# Patient Record
Sex: Male | Born: 1970 | Race: White | Hispanic: No | Marital: Married | Smoking: Current every day smoker
Health system: Southern US, Community
[De-identification: ages and names within clinical notes are randomized; demographics above are authoritative.]

## PROBLEM LIST (undated history)

## (undated) DIAGNOSIS — I10 Essential (primary) hypertension: Secondary | ICD-10-CM

## (undated) HISTORY — DX: Essential (primary) hypertension: I10

---

## 2003-01-08 ENCOUNTER — Ambulatory Visit (HOSPITAL_COMMUNITY): Admission: RE | Admit: 2003-01-08 | Discharge: 2003-01-08 | Payer: Self-pay | Admitting: Family Medicine

## 2003-01-08 ENCOUNTER — Encounter: Payer: Self-pay | Admitting: Family Medicine

## 2005-11-04 ENCOUNTER — Ambulatory Visit (HOSPITAL_COMMUNITY): Admission: RE | Admit: 2005-11-04 | Discharge: 2005-11-04 | Payer: Self-pay | Admitting: Family Medicine

## 2005-11-04 ENCOUNTER — Emergency Department (HOSPITAL_COMMUNITY): Admission: EM | Admit: 2005-11-04 | Discharge: 2005-11-04 | Payer: Self-pay | Admitting: Family Medicine

## 2005-11-06 ENCOUNTER — Emergency Department (HOSPITAL_COMMUNITY): Admission: EM | Admit: 2005-11-06 | Discharge: 2005-11-06 | Payer: Self-pay | Admitting: Family Medicine

## 2005-11-16 ENCOUNTER — Ambulatory Visit: Payer: Self-pay | Admitting: Internal Medicine

## 2006-04-25 ENCOUNTER — Ambulatory Visit: Payer: Self-pay | Admitting: Internal Medicine

## 2006-05-12 ENCOUNTER — Ambulatory Visit: Payer: Self-pay | Admitting: Internal Medicine

## 2006-05-12 DIAGNOSIS — R079 Chest pain, unspecified: Secondary | ICD-10-CM

## 2006-09-01 DIAGNOSIS — M79609 Pain in unspecified limb: Secondary | ICD-10-CM

## 2006-09-12 DIAGNOSIS — F411 Generalized anxiety disorder: Secondary | ICD-10-CM | POA: Insufficient documentation

## 2006-09-12 DIAGNOSIS — A5601 Chlamydial cystitis and urethritis: Secondary | ICD-10-CM

## 2006-09-12 DIAGNOSIS — F17200 Nicotine dependence, unspecified, uncomplicated: Secondary | ICD-10-CM

## 2006-09-12 DIAGNOSIS — N453 Epididymo-orchitis: Secondary | ICD-10-CM | POA: Insufficient documentation

## 2006-09-12 DIAGNOSIS — I10 Essential (primary) hypertension: Secondary | ICD-10-CM | POA: Insufficient documentation

## 2006-09-15 ENCOUNTER — Ambulatory Visit: Payer: Self-pay | Admitting: Internal Medicine

## 2006-09-19 ENCOUNTER — Telehealth (INDEPENDENT_AMBULATORY_CARE_PROVIDER_SITE_OTHER): Payer: Self-pay | Admitting: *Deleted

## 2006-09-26 ENCOUNTER — Encounter (INDEPENDENT_AMBULATORY_CARE_PROVIDER_SITE_OTHER): Payer: Self-pay | Admitting: *Deleted

## 2006-10-06 ENCOUNTER — Ambulatory Visit: Payer: Self-pay | Admitting: Internal Medicine

## 2006-11-24 ENCOUNTER — Ambulatory Visit: Payer: Self-pay | Admitting: Family Medicine

## 2006-11-24 ENCOUNTER — Encounter (INDEPENDENT_AMBULATORY_CARE_PROVIDER_SITE_OTHER): Payer: Self-pay | Admitting: Internal Medicine

## 2006-12-01 ENCOUNTER — Ambulatory Visit: Payer: Self-pay | Admitting: Internal Medicine

## 2006-12-05 LAB — CONVERTED CEMR LAB
ALT: 22 units/L (ref 0–53)
AST: 21 units/L (ref 0–37)
Albumin: 4.1 g/dL (ref 3.5–5.2)
Alkaline Phosphatase: 87 units/L (ref 39–117)
BUN: 11 mg/dL (ref 6–23)
Basophils Absolute: 0 10*3/uL (ref 0.0–0.1)
Basophils Relative: 0.4 % (ref 0.0–1.0)
Bilirubin, Direct: 0.1 mg/dL (ref 0.0–0.3)
CO2: 29 meq/L (ref 19–32)
Calcium: 9.2 mg/dL (ref 8.4–10.5)
Chloride: 105 meq/L (ref 96–112)
Cholesterol: 203 mg/dL (ref 0–200)
Creatinine, Ser: 0.9 mg/dL (ref 0.4–1.5)
Direct LDL: 174.3 mg/dL
Eosinophils Absolute: 0.2 10*3/uL (ref 0.0–0.6)
Eosinophils Relative: 2.6 % (ref 0.0–5.0)
GFR calc Af Amer: 125 mL/min
GFR calc non Af Amer: 103 mL/min
Glucose, Bld: 104 mg/dL — ABNORMAL HIGH (ref 70–99)
HCT: 44.1 % (ref 39.0–52.0)
HDL: 25.6 mg/dL — ABNORMAL LOW (ref >39.0)
Hemoglobin: 15.2 g/dL (ref 13.0–17.0)
Lymphocytes Relative: 36 % (ref 12.0–46.0)
MCHC: 34.5 g/dL (ref 30.0–36.0)
MCV: 89.8 fL (ref 78.0–100.0)
Monocytes Absolute: 0.6 10*3/uL (ref 0.2–0.7)
Monocytes Relative: 9.4 % (ref 3.0–11.0)
Neutro Abs: 3.2 10*3/uL (ref 1.4–7.7)
Neutrophils Relative %: 51.6 % (ref 43.0–77.0)
Platelets: 296 10*3/uL (ref 150–400)
Potassium: 4.6 meq/L (ref 3.5–5.1)
RBC: 4.91 M/uL (ref 4.22–5.81)
RDW: 13.2 % (ref 11.5–14.6)
Sodium: 139 meq/L (ref 135–145)
TSH: 0.85 microintl units/mL (ref 0.35–5.50)
Total Bilirubin: 0.9 mg/dL (ref 0.3–1.2)
Total CHOL/HDL Ratio: 7.9
Total Protein: 6.8 g/dL (ref 6.0–8.3)
Triglycerides: 75 mg/dL (ref 0–149)
VLDL: 15 mg/dL (ref 0–40)
WBC: 6.2 10*3/uL (ref 4.5–10.5)

## 2007-03-02 ENCOUNTER — Telehealth (INDEPENDENT_AMBULATORY_CARE_PROVIDER_SITE_OTHER): Payer: Self-pay | Admitting: *Deleted

## 2007-06-08 ENCOUNTER — Ambulatory Visit: Payer: Self-pay | Admitting: Internal Medicine

## 2007-06-11 ENCOUNTER — Encounter (INDEPENDENT_AMBULATORY_CARE_PROVIDER_SITE_OTHER): Payer: Self-pay | Admitting: *Deleted

## 2007-10-11 ENCOUNTER — Ambulatory Visit: Payer: Self-pay | Admitting: Family Medicine

## 2007-10-11 DIAGNOSIS — J069 Acute upper respiratory infection, unspecified: Secondary | ICD-10-CM | POA: Insufficient documentation

## 2008-12-12 ENCOUNTER — Ambulatory Visit: Payer: Self-pay | Admitting: Internal Medicine

## 2008-12-12 DIAGNOSIS — S60529A Blister (nonthermal) of unspecified hand, initial encounter: Secondary | ICD-10-CM | POA: Insufficient documentation

## 2009-08-08 ENCOUNTER — Ambulatory Visit: Payer: Self-pay | Admitting: Internal Medicine

## 2013-03-09 ENCOUNTER — Other Ambulatory Visit: Payer: Self-pay | Admitting: Psychiatry

## 2013-03-09 LAB — COMPREHENSIVE METABOLIC PANEL
Albumin: 3.7 g/dL (ref 3.4–5.0)
Alkaline Phosphatase: 108 U/L
Anion Gap: 3 — ABNORMAL LOW (ref 7–16)
BUN: 12 mg/dL (ref 7–18)
Bilirubin,Total: 0.3 mg/dL (ref 0.2–1.0)
Calcium, Total: 8.8 mg/dL (ref 8.5–10.1)
Chloride: 109 mmol/L — ABNORMAL HIGH (ref 98–107)
Co2: 27 mmol/L (ref 21–32)
Creatinine: 0.99 mg/dL (ref 0.60–1.30)
EGFR (African American): >60
EGFR (Non-African Amer.): >60
Glucose: 121 mg/dL — ABNORMAL HIGH (ref 65–99)
Osmolality: 279 (ref 275–301)
Potassium: 4.3 mmol/L (ref 3.5–5.1)
SGOT(AST): 15 U/L (ref 15–37)
SGPT (ALT): 29 U/L (ref 12–78)
Sodium: 139 mmol/L (ref 136–145)
Total Protein: 6.9 g/dL (ref 6.4–8.2)

## 2013-03-09 LAB — CBC WITH DIFFERENTIAL/PLATELET
Basophil #: 0.1 10*3/uL (ref 0.0–0.1)
Basophil %: 0.8 %
Eosinophil #: 0.2 10*3/uL (ref 0.0–0.7)
Eosinophil %: 3.1 %
HCT: 44.3 % (ref 40.0–52.0)
HGB: 14.9 g/dL (ref 13.0–18.0)
Lymphocyte #: 2.5 10*3/uL (ref 1.0–3.6)
Lymphocyte %: 35.7 %
MCH: 29.7 pg (ref 26.0–34.0)
MCHC: 33.6 g/dL (ref 32.0–36.0)
MCV: 89 fL (ref 80–100)
Monocyte #: 0.7 x10 3/mm (ref 0.2–1.0)
Monocyte %: 9.6 %
Neutrophil #: 3.6 10*3/uL (ref 1.4–6.5)
Neutrophil %: 50.8 %
Platelet: 305 10*3/uL (ref 150–440)
RBC: 5 10*6/uL (ref 4.40–5.90)
RDW: 13.9 % (ref 11.5–14.5)
WBC: 7 10*3/uL (ref 3.8–10.6)

## 2013-03-09 LAB — VALPROIC ACID LEVEL: Valproic Acid: 76 ug/mL

## 2013-04-06 ENCOUNTER — Other Ambulatory Visit: Payer: Self-pay | Admitting: Psychiatry

## 2013-04-06 LAB — CBC WITH DIFFERENTIAL/PLATELET
BASOS PCT: 1.1 %
Basophil #: 0.1 10*3/uL (ref 0.0–0.1)
Eosinophil #: 0.3 10*3/uL (ref 0.0–0.7)
Eosinophil %: 4.2 %
HCT: 43.7 % (ref 40.0–52.0)
HGB: 15.1 g/dL (ref 13.0–18.0)
LYMPHS ABS: 2.3 10*3/uL (ref 1.0–3.6)
Lymphocyte %: 30.1 %
MCH: 30.8 pg (ref 26.0–34.0)
MCHC: 34.6 g/dL (ref 32.0–36.0)
MCV: 89 fL (ref 80–100)
MONO ABS: 1 x10 3/mm (ref 0.2–1.0)
Monocyte %: 12.5 %
NEUTROS ABS: 4 10*3/uL (ref 1.4–6.5)
NEUTROS PCT: 52.1 %
PLATELETS: 299 10*3/uL (ref 150–440)
RBC: 4.9 10*6/uL (ref 4.40–5.90)
RDW: 14.1 % (ref 11.5–14.5)
WBC: 7.8 10*3/uL (ref 3.8–10.6)

## 2013-04-06 LAB — COMPREHENSIVE METABOLIC PANEL
ALT: 32 U/L (ref 12–78)
Albumin: 3.5 g/dL (ref 3.4–5.0)
Alkaline Phosphatase: 93 U/L
Anion Gap: 6 — ABNORMAL LOW (ref 7–16)
BUN: 15 mg/dL (ref 7–18)
Bilirubin,Total: 0.3 mg/dL (ref 0.2–1.0)
CHLORIDE: 107 mmol/L (ref 98–107)
Calcium, Total: 8.5 mg/dL (ref 8.5–10.1)
Co2: 26 mmol/L (ref 21–32)
Creatinine: 0.91 mg/dL (ref 0.60–1.30)
EGFR (African American): >60
GLUCOSE: 100 mg/dL — AB (ref 65–99)
Osmolality: 278 (ref 275–301)
Potassium: 4.7 mmol/L (ref 3.5–5.1)
SGOT(AST): 15 U/L (ref 15–37)
Sodium: 139 mmol/L (ref 136–145)
TOTAL PROTEIN: 7 g/dL (ref 6.4–8.2)

## 2013-04-06 LAB — VALPROIC ACID LEVEL: Valproic Acid: 65 ug/mL

## 2013-06-07 ENCOUNTER — Ambulatory Visit: Payer: Self-pay | Admitting: Otolaryngology

## 2013-07-19 ENCOUNTER — Ambulatory Visit: Payer: Self-pay | Admitting: Otolaryngology

## 2014-02-25 ENCOUNTER — Ambulatory Visit: Payer: Self-pay | Admitting: Physician Assistant

## 2014-02-28 ENCOUNTER — Emergency Department: Payer: Self-pay | Admitting: Emergency Medicine

## 2015-02-04 ENCOUNTER — Other Ambulatory Visit: Payer: Self-pay | Admitting: Occupational Medicine

## 2015-02-04 ENCOUNTER — Ambulatory Visit: Payer: Self-pay

## 2015-02-04 DIAGNOSIS — Z0000 Encounter for general adult medical examination without abnormal findings: Secondary | ICD-10-CM

## 2015-08-25 IMAGING — CT CT CHEST W/O CM
2 of 4 series · 15 of 36 positions shown, 18 images · non-contrast
Comparison: February 25, 2014 chest radiograph

CLINICAL DATA: Cough and sudden onset left-sided pain

EXAM:
CT CHEST WITHOUT CONTRAST
TECHNIQUE: Multidetector CT imaging of the chest was performed following the
standard protocol without IV contrast material administration.

[Series 2: routine chest wo · axial · 0.75mm/px · z∈[-770,-494]mm · 12 of 67 slices shown, 15 images]
[im 6/67  mediastinal]
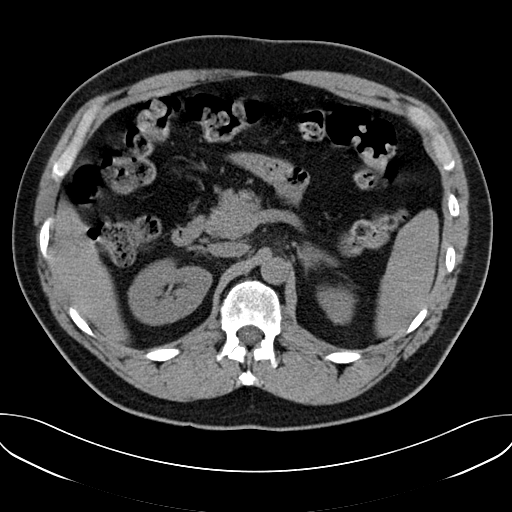
[im 6/67  lung]
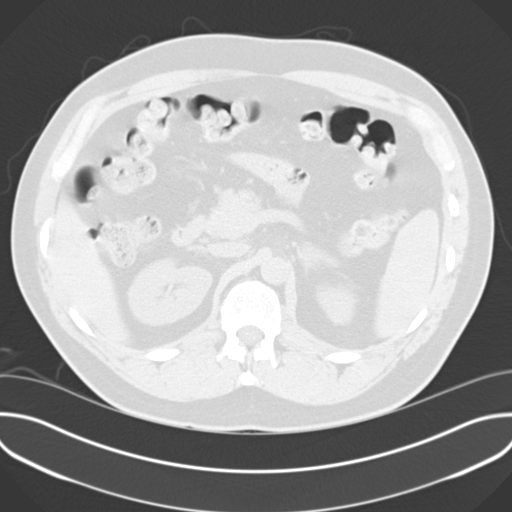
[im 11/67  lung]
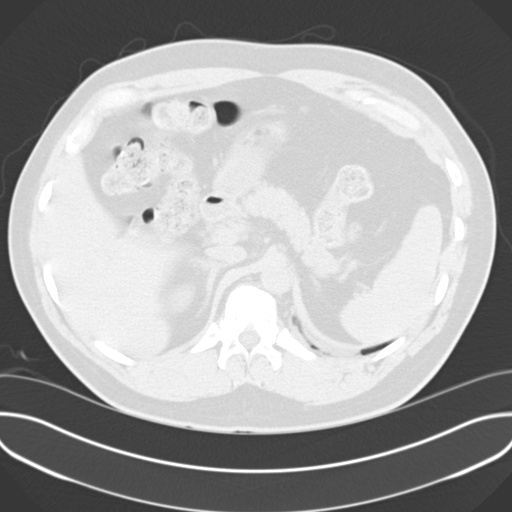
[im 16/67  lung]
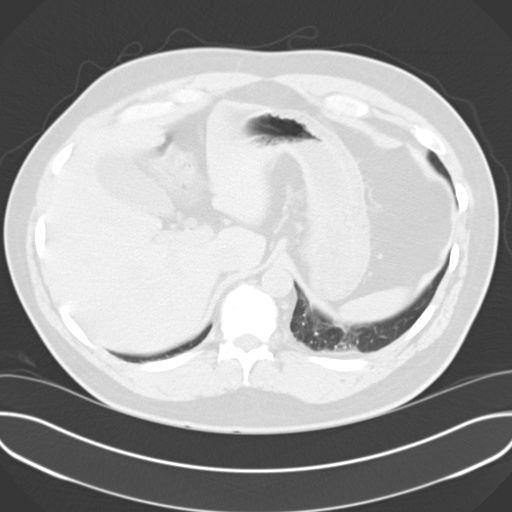
[im 21/67  lung]
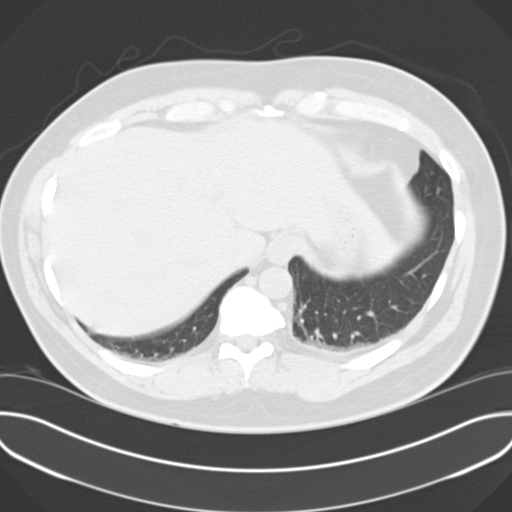
[im 26/67  mediastinal]
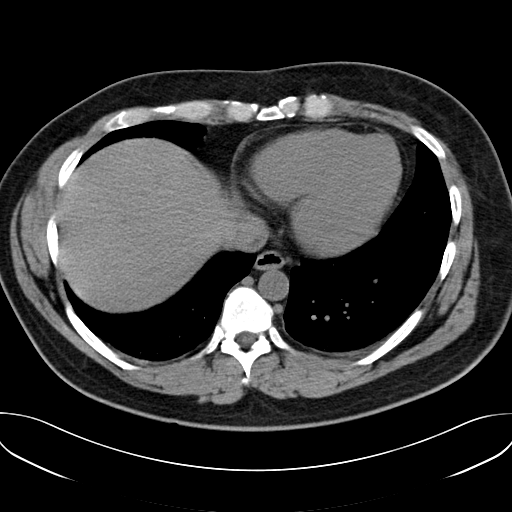
[im 26/67  lung]
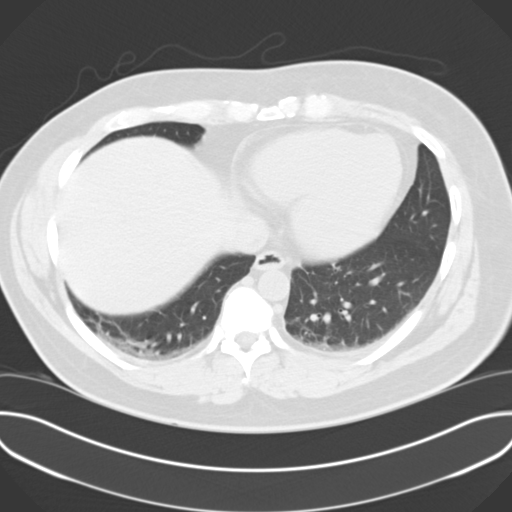
[im 31/67  lung]
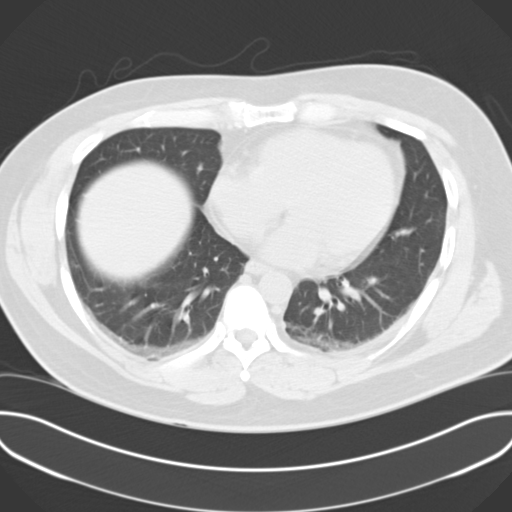
[im 36/67  lung]
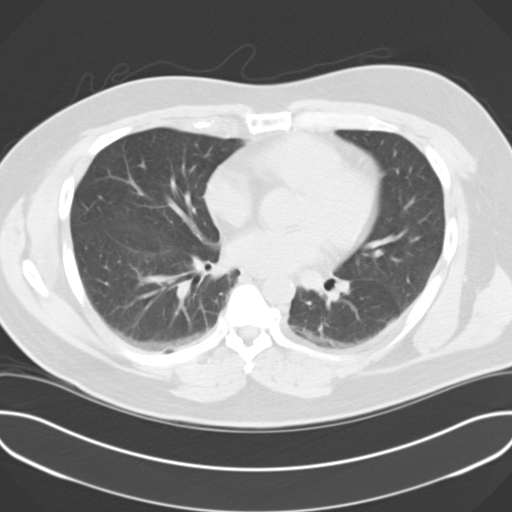
[im 41/67  lung]
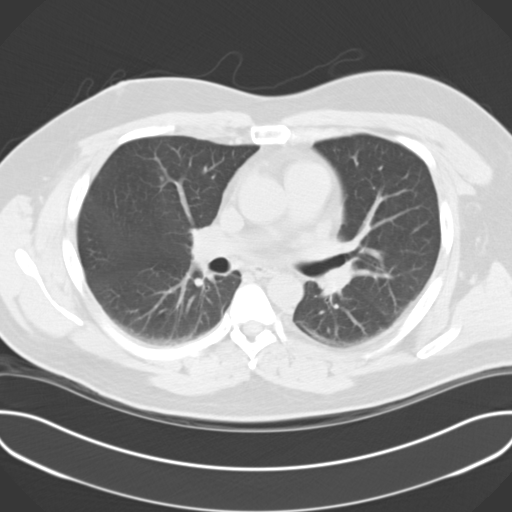
[im 46/67  mediastinal]
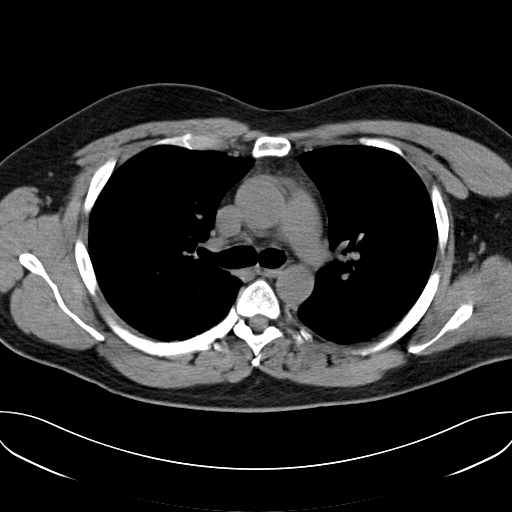
[im 46/67  lung]
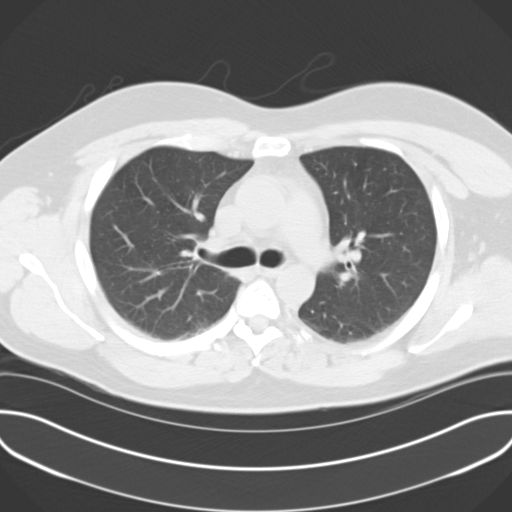
[im 51/67  lung]
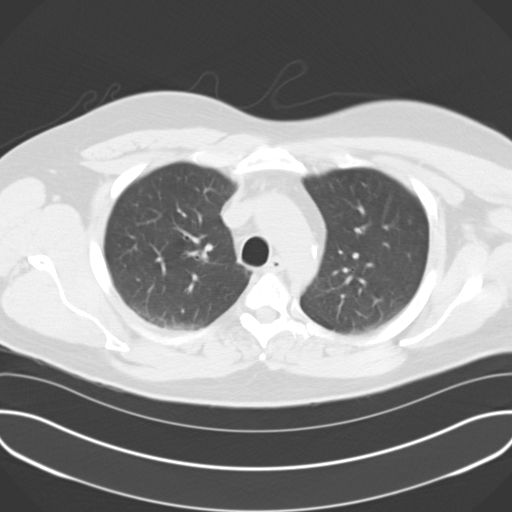
[im 56/67  lung]
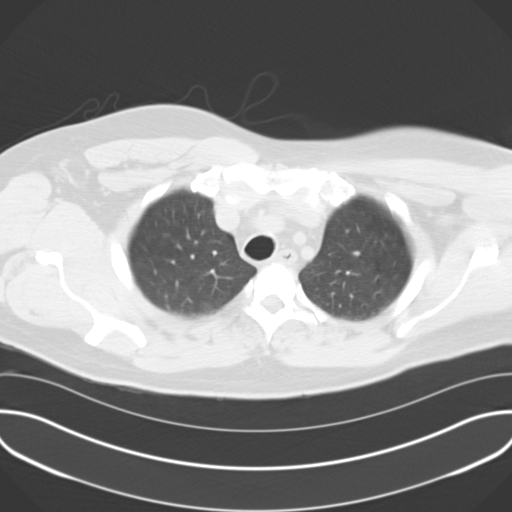
[im 61/67  lung]
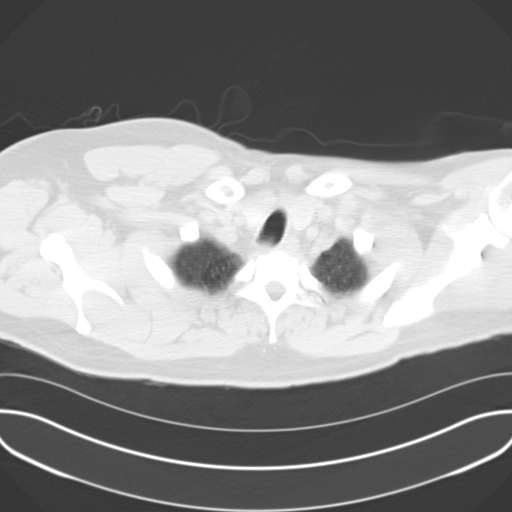

[Series 5: cor routine chest wo · coronal · 0.64mm/px · 3 of 134 slices shown]
[im 27/134  lung]
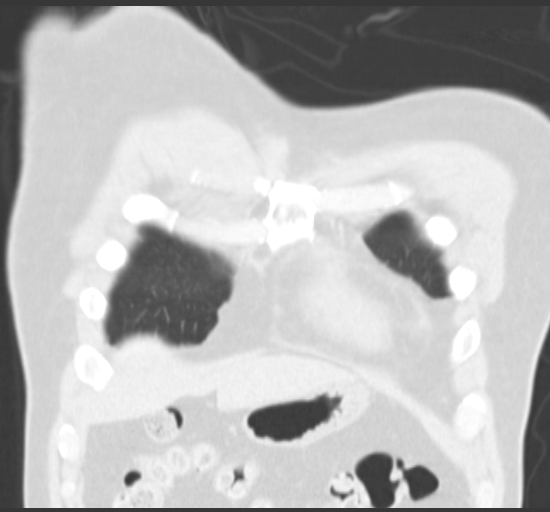
[im 54/134  lung]
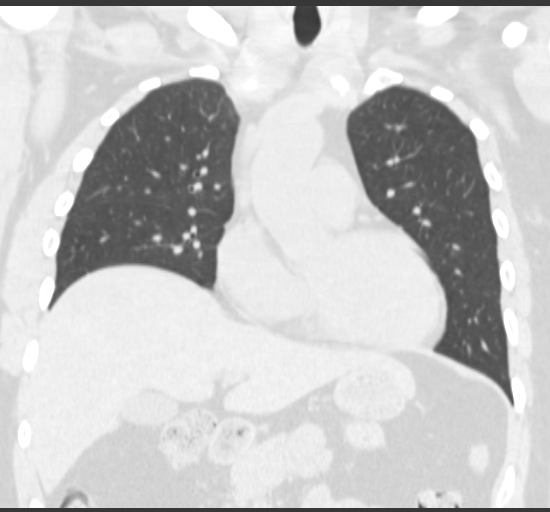
[im 80/134  lung]
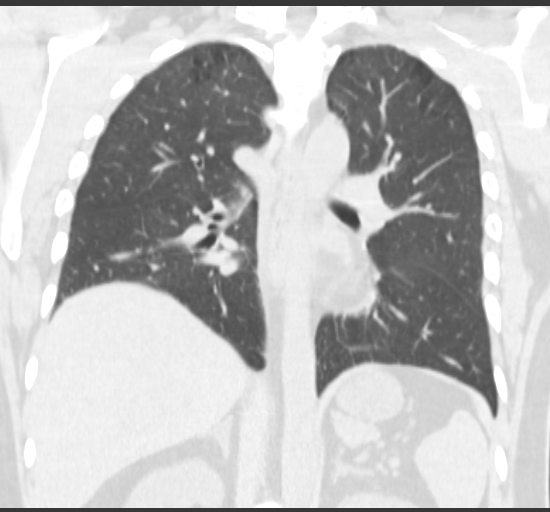

[15 of 36 positions shown; findings below may reference images not displayed]

FINDINGS: There is mild bibasilar lung atelectatic change. There is no
pneumothorax. There is no edema or consolidation.

There is mild atherosclerotic change in the aortic arch region. No
thoracic aortic aneurysm is appreciable. There is no periaortic
fluid. No pulmonary embolus is seen on this noncontrast enhanced
study. There is no appreciable thoracic adenopathy. The pericardium
is not thickened.

Visualized upper abdominal structures appear unremarkable.

There is a nondisplaced fracture of the posterolateral left eighth
rib. No other fractures are apparent. Visualized thyroid appears
normal.
IMPRESSION: Nondisplaced fracture posterolateral left eighth rib. No
pneumothorax. Mild bibasilar atelectatic change. No lung edema or
consolidation. Study otherwise unremarkable except for some
atherosclerotic change in the aortic arch region with calcification.

## 2015-11-21 ENCOUNTER — Emergency Department
Admission: EM | Admit: 2015-11-21 | Discharge: 2015-11-21 | Disposition: A | Discharge status: Home / Self Care | Payer: Commercial Managed Care - PPO | Attending: Emergency Medicine | Admitting: Emergency Medicine

## 2015-11-21 DIAGNOSIS — S61212A Laceration without foreign body of right middle finger without damage to nail, initial encounter: Secondary | ICD-10-CM | POA: Diagnosis present

## 2015-11-21 DIAGNOSIS — I10 Essential (primary) hypertension: Secondary | ICD-10-CM | POA: Insufficient documentation

## 2015-11-21 DIAGNOSIS — Y990 Civilian activity done for income or pay: Secondary | ICD-10-CM | POA: Insufficient documentation

## 2015-11-21 DIAGNOSIS — W231XXA Caught, crushed, jammed, or pinched between stationary objects, initial encounter: Secondary | ICD-10-CM | POA: Insufficient documentation

## 2015-11-21 DIAGNOSIS — Y9389 Activity, other specified: Secondary | ICD-10-CM | POA: Insufficient documentation

## 2015-11-21 DIAGNOSIS — S61202A Unspecified open wound of right middle finger without damage to nail, initial encounter: Secondary | ICD-10-CM | POA: Insufficient documentation

## 2015-11-21 DIAGNOSIS — Y9269 Other specified industrial and construction area as the place of occurrence of the external cause: Secondary | ICD-10-CM | POA: Diagnosis not present

## 2015-11-21 DIAGNOSIS — S61208A Unspecified open wound of other finger without damage to nail, initial encounter: Secondary | ICD-10-CM

## 2015-11-21 MED ORDER — TETANUS-DIPHTH-ACELL PERTUSSIS 5-2.5-18.5 LF-MCG/0.5 IM SUSP
0.5000 mL | Freq: Once | INTRAMUSCULAR | Status: AC
  Administered 2015-11-21: 0.5 mL via INTRAMUSCULAR
  Filled 2015-11-21: qty 0.5

## 2015-11-21 NOTE — Discharge Instructions (Signed)
Keep area clean and dry. Wash daily with mild soap and water. Watch for signs of infection. Today you are given a tetanus booster.

## 2015-11-21 NOTE — ED Triage Notes (Signed)
Patient presents to the ED with a avulsion to the right middle finger tip at 1000am on 11/20/15. Patient is not up to date on tetanus.

## 2015-11-21 NOTE — ED Provider Notes (Signed)
[facility] Emergency Department Provider Note  ____________________________________________   First MD Initiated Contact with Patient 11/21/15 1106     (approximate)  I have reviewed the triage vital signs and the nursing notes.   HISTORY  Chief Complaint Laceration   HPI Victor Ali is a 45 y.o. male is here with complaint of an avulsion to the tip of his right middle finger that happened at 10 AM yesterday. Patient did clean the area and has had a dressing on it. Patient is not up-to-date on immunizations. 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 Currently he is rating his pain as a 6/10.   No past medical history on file.  Patient Active Problem List   Diagnosis Date Noted  . 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 URI 10/11/2007  . CHLAMYDIAL INFECTION 09/12/2006  . ANXIETY 09/12/2006  . TOBACCO ABUSE 09/12/2006  . HYPERTENSION 09/12/2006  . EPIDIDYMITIS 09/12/2006  . FINGER PAIN 09/01/2006  . CHEST PAIN 05/12/2006    No past surgical history on file.  Prior to Admission medications   Not on File    Allergies Review of patient's allergies indicates not on file.  No family history on file.  Social History Social History  Substance Use Topics  . Smoking status: Not on file  . Smokeless tobacco: Not on file  . Alcohol use Not on file    Review of Systems Constitutional: No fever/chills Cardiovascular: Denies chest pain. Respiratory: Denies shortness of breath. Gastrointestinal:   No nausea, no vomiting.  Musculoskeletal: Negative for back pain. Skin: Skin avulsion right third finger. Neurological: Negative for headaches, focal weakness or numbness.  10-point ROS otherwise negative.  ____________________________________________   PHYSICAL EXAM:  VITAL SIGNS: ED Triage Vitals  Enc Vitals Group     BP 11/21/15 1059 (!) 141/79     Pulse Rate 11/21/15 1059 79     Resp 11/21/15  1059 18     Temp 11/21/15 1059 97.9 F (36.6 C)     Temp Source 11/21/15 1059 Oral     SpO2 11/21/15 1059 98 %     Weight 11/21/15 1059 202 lb (91.6 kg)     Height 11/21/15 1059 5\' 11"  (1.803 m)     Head Circumference --      Peak Flow --      Pain Score 11/21/15 1100 6     Pain Loc --      Pain Edu? --      Excl. in GC? --     Constitutional: Alert and oriented. Well appearing and in no acute distress. Eyes: Conjunctivae are normal. PERRL. EOMI. Head: Atraumatic. Neck: No stridor.   Cardiovascular: Normal rate, regular rhythm. Grossly normal heart sounds.  Good peripheral circulation. Respiratory: Normal respiratory effort.  No retractions. Lungs CTAB. Musculoskeletal: Moves right middle finger without any difficulty. No gross deformity was noted. Motor sensory function intact. Neurologic:  Normal speech and language. No gross focal neurologic deficits are appreciated. No gait instability. Skin:  Skin is warm, dry.  There are aspect distal tip right third finger there is partial-thickness skin avulsed without any active bleeding or signs of infection. Psychiatric: Mood and affect are normal. Speech and behavior are normal.  ____________________________________________   LABS (all labs ordered are listed, but only abnormal results are displayed)  Labs Reviewed - No data to display  PROCEDURES  Procedure(s) performed: None  Procedures  Critical Care performed: No  ____________________________________________  INITIAL IMPRESSION / ASSESSMENT AND PLAN / ED COURSE  Pertinent labs & imaging results that were available during my care of the patient were reviewed by me and considered in my medical decision making (see chart for details).    Clinical Course   Patient's tetanus was updated. Area was cleaned and a dressing was placed. Patient is aware that he needs to keep this area clean and dry. He is to watch for signs of infection. He is encouraged to wash area with  mild soap and water twice a day. Patient is to take Tylenol or ibuprofen as needed for pain. He will return to the emergency room or see his primary care doctor if any signs of infection.  ____________________________________________   FINAL CLINICAL IMPRESSION(S) / ED DIAGNOSES  Final diagnoses:  Avulsion of skin of middle finger, initial encounter      NEW MEDICATIONS STARTED DURING THIS VISIT:  There are no discharge medications for this patient.    Note:  This document was prepared using Dragon voice recognition software and may include unintentional dictation errors.    Tommi Rumps, PA-C 11/21/15 1503    Myrna Blazer, MD 11/26/15 2110

## 2015-11-21 NOTE — ED Notes (Signed)
Patient states that he was at work yesterday and was lifting a piece of equipment his finger got caught and it pulled the top of the skin off of his right middle finger.

## 2016-07-31 IMAGING — CR DG CHEST 1V
2 series · 2 of 2 positions shown · non-contrast
Comparison: CT chest of 02/28/2014 and chest x-ray of 02/25/2014

CLINICAL DATA: For physical exam, current smoking history

EXAM:
CHEST 1 VIEW

[view not recorded (1 of 2)]
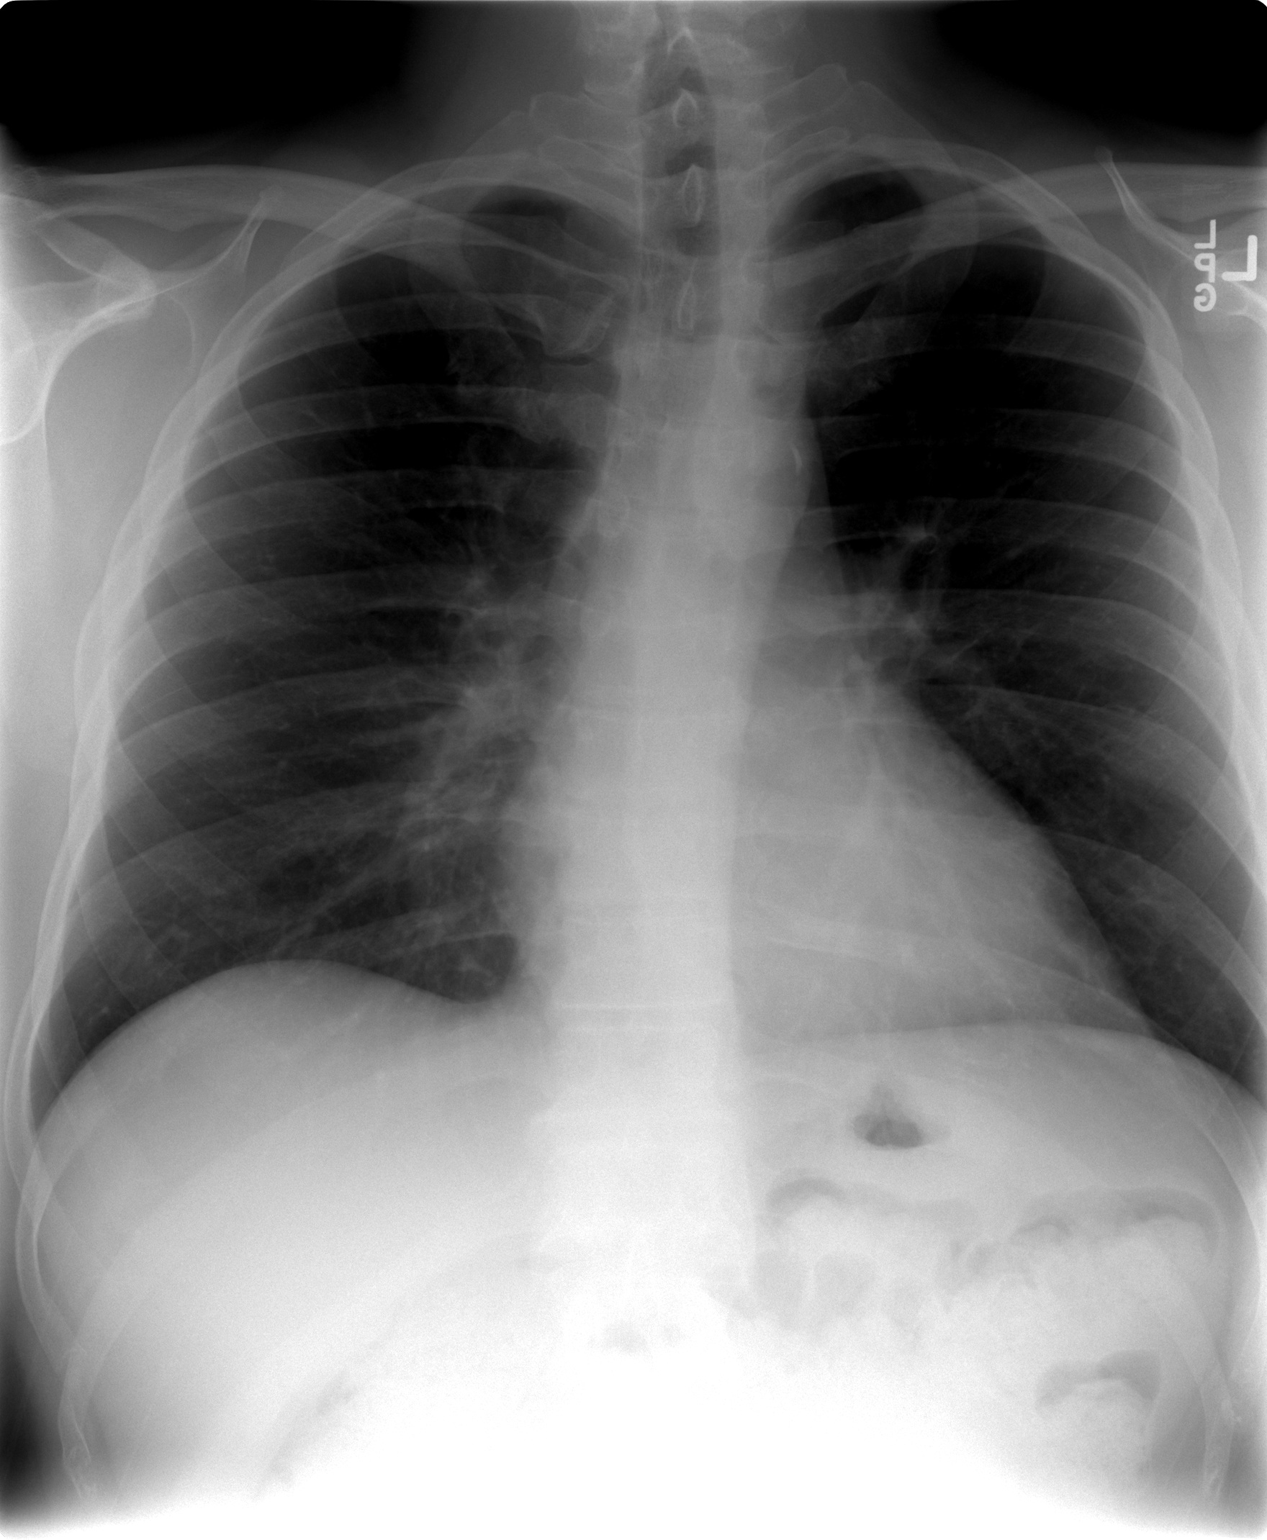

[view not recorded (2 of 2)]
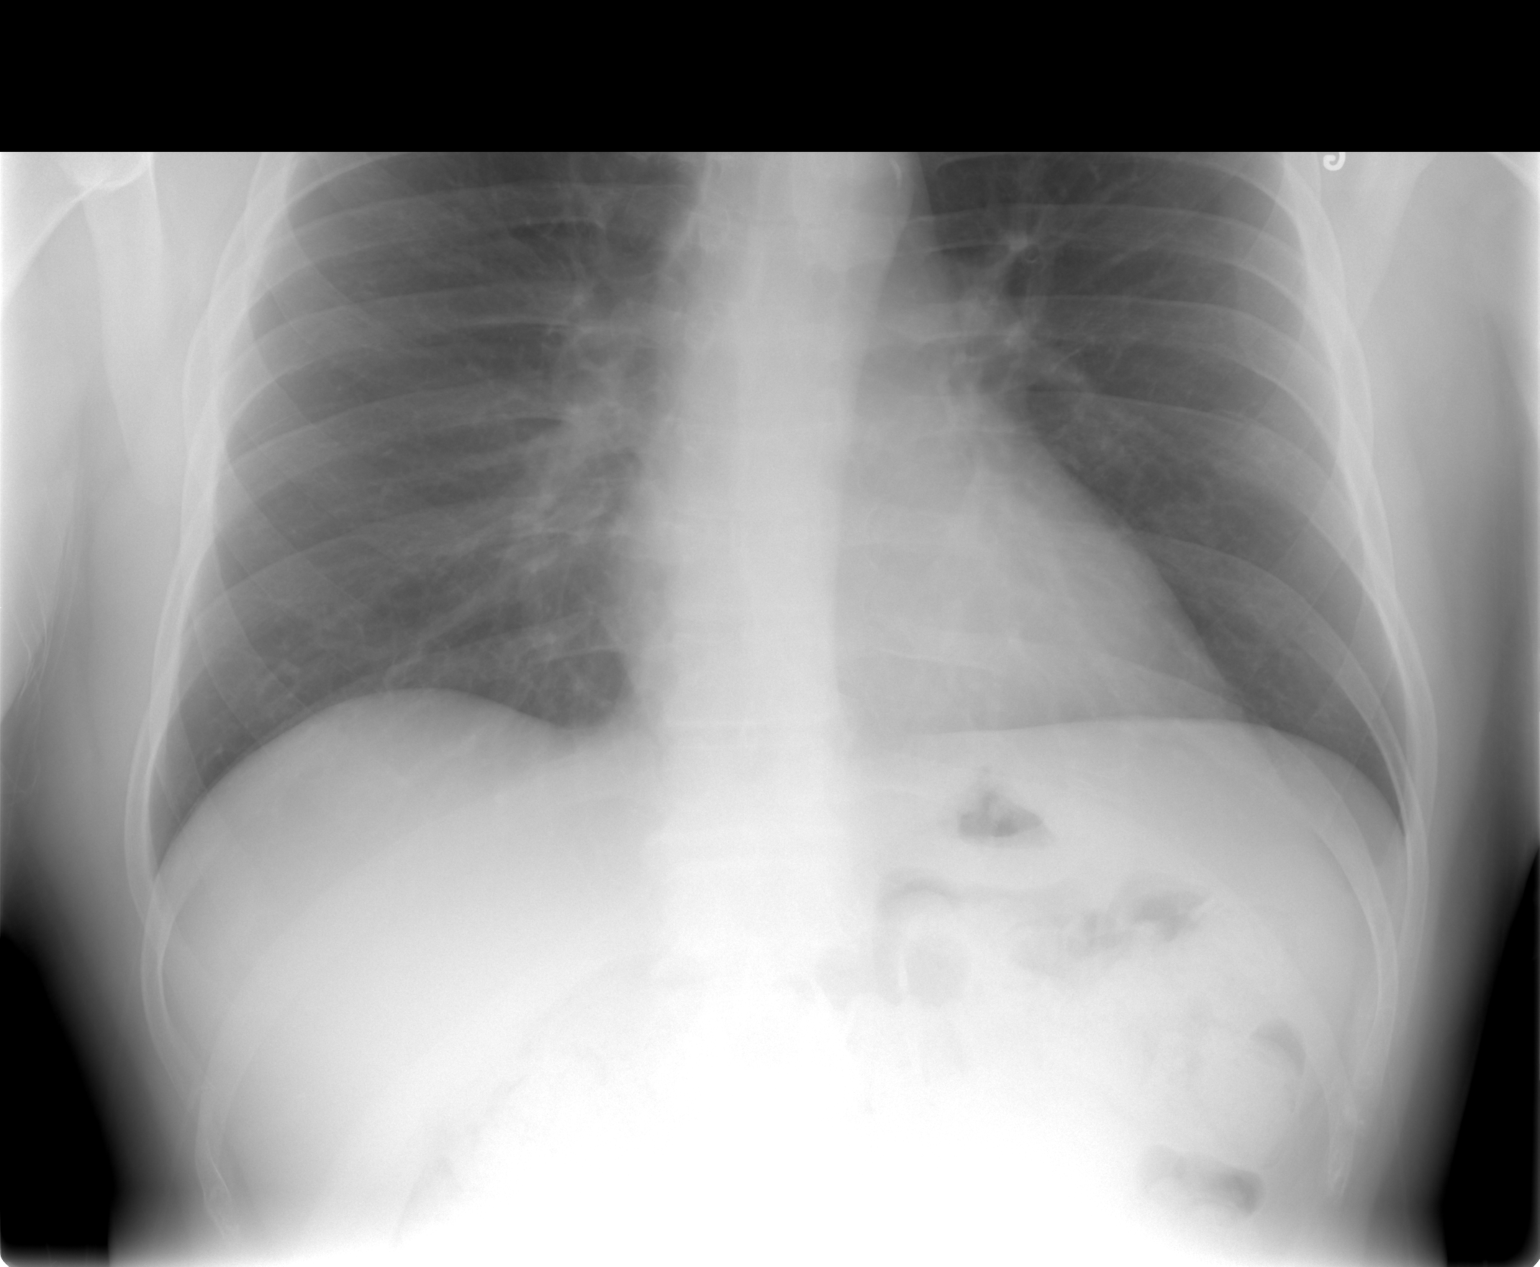

[2 of 2 positions shown; findings below may reference images not displayed]

FINDINGS: No active infiltrate or effusion is seen. Mediastinal and hilar
contours are unremarkable. The heart is within normal limits in
size. No bony abnormality is seen.
IMPRESSION: No active disease.

## 2023-01-07 ENCOUNTER — Inpatient Hospital Stay (HOSPITAL_COMMUNITY)
Admission: EM | Admit: 2023-01-07 | Discharge: 2023-01-10 | DRG: 914 | Disposition: A | Discharge status: Home / Self Care | Payer: BC Managed Care – PPO | Attending: General Surgery | Admitting: General Surgery

## 2023-01-07 ENCOUNTER — Other Ambulatory Visit: Payer: Self-pay

## 2023-01-07 ENCOUNTER — Emergency Department (HOSPITAL_COMMUNITY): Payer: BC Managed Care – PPO

## 2023-01-07 DIAGNOSIS — S71131A Puncture wound without foreign body, right thigh, initial encounter: Principal | ICD-10-CM

## 2023-01-07 DIAGNOSIS — W3400XA Accidental discharge from unspecified firearms or gun, initial encounter: Secondary | ICD-10-CM

## 2023-01-07 DIAGNOSIS — I10 Essential (primary) hypertension: Secondary | ICD-10-CM | POA: Diagnosis present

## 2023-01-07 DIAGNOSIS — S7411XA Injury of femoral nerve at hip and thigh level, right leg, initial encounter: Secondary | ICD-10-CM | POA: Diagnosis present

## 2023-01-07 DIAGNOSIS — Z6829 Body mass index (BMI) 29.0-29.9, adult: Secondary | ICD-10-CM

## 2023-01-07 DIAGNOSIS — E669 Obesity, unspecified: Secondary | ICD-10-CM | POA: Diagnosis present

## 2023-01-07 DIAGNOSIS — Z888 Allergy status to other drugs, medicaments and biological substances status: Secondary | ICD-10-CM

## 2023-01-07 DIAGNOSIS — S31143A Puncture wound of abdominal wall with foreign body, right lower quadrant without penetration into peritoneal cavity, initial encounter: Principal | ICD-10-CM | POA: Diagnosis present

## 2023-01-07 DIAGNOSIS — E872 Acidosis, unspecified: Secondary | ICD-10-CM | POA: Diagnosis present

## 2023-01-07 DIAGNOSIS — E785 Hyperlipidemia, unspecified: Secondary | ICD-10-CM | POA: Diagnosis present

## 2023-01-07 DIAGNOSIS — Z79899 Other long term (current) drug therapy: Secondary | ICD-10-CM

## 2023-01-07 DIAGNOSIS — D72829 Elevated white blood cell count, unspecified: Secondary | ICD-10-CM | POA: Diagnosis present

## 2023-01-07 LAB — I-STAT CHEM 8, ED
BUN: 13 mg/dL (ref 6–20)
Calcium, Ion: 1.1 mmol/L — ABNORMAL LOW (ref 1.15–1.40)
Chloride: 107 mmol/L (ref 98–111)
Creatinine, Ser: 1.1 mg/dL (ref 0.61–1.24)
Glucose, Bld: 123 mg/dL — ABNORMAL HIGH (ref 70–99)
HCT: 42 % (ref 39.0–52.0)
Hemoglobin: 14.3 g/dL (ref 13.0–17.0)
Potassium: 3.6 mmol/L (ref 3.5–5.1)
Sodium: 139 mmol/L (ref 135–145)
TCO2: 21 mmol/L — ABNORMAL LOW (ref 22–32)

## 2023-01-07 LAB — PROTIME-INR
INR: 1 (ref 0.8–1.2)
Prothrombin Time: 13.1 s (ref 11.4–15.2)

## 2023-01-07 LAB — CBC
HCT: 41.3 % (ref 39.0–52.0)
HCT: 41.2 % (ref 39.0–52.0)
Hemoglobin: 14.2 g/dL (ref 13.0–17.0)
Hemoglobin: 14.2 g/dL (ref 13.0–17.0)
MCH: 30.7 pg (ref 26.0–34.0)
MCH: 31.6 pg (ref 26.0–34.0)
MCHC: 34.4 g/dL (ref 30.0–36.0)
MCHC: 34.5 g/dL (ref 30.0–36.0)
MCV: 89.2 fL (ref 80.0–100.0)
MCV: 91.6 fL (ref 80.0–100.0)
Platelets: 361 10*3/uL (ref 150–400)
Platelets: 315 10*3/uL (ref 150–400)
RBC: 4.63 MIL/uL (ref 4.22–5.81)
RBC: 4.5 MIL/uL (ref 4.22–5.81)
RDW: 13.8 % (ref 11.5–15.5)
RDW: 13.8 % (ref 11.5–15.5)
WBC: 11.3 10*3/uL — ABNORMAL HIGH (ref 4.0–10.5)
WBC: 15.2 10*3/uL — ABNORMAL HIGH (ref 4.0–10.5)
nRBC: 0 % (ref 0.0–0.2)
nRBC: 0 % (ref 0.0–0.2)

## 2023-01-07 LAB — COMPREHENSIVE METABOLIC PANEL
ALT: 31 U/L (ref 0–44)
AST: 30 U/L (ref 15–41)
Albumin: 3.5 g/dL (ref 3.5–5.0)
Alkaline Phosphatase: 83 U/L (ref 38–126)
Anion gap: 14 (ref 5–15)
BUN: 12 mg/dL (ref 6–20)
CO2: 19 mmol/L — ABNORMAL LOW (ref 22–32)
Calcium: 8.6 mg/dL — ABNORMAL LOW (ref 8.9–10.3)
Chloride: 106 mmol/L (ref 98–111)
Creatinine, Ser: 1.2 mg/dL (ref 0.61–1.24)
GFR, Estimated: >60 mL/min (ref >60)
Glucose, Bld: 125 mg/dL — ABNORMAL HIGH (ref 70–99)
Potassium: 3.6 mmol/L (ref 3.5–5.1)
Sodium: 139 mmol/L (ref 135–145)
Total Bilirubin: 0.6 mg/dL (ref 0.3–1.2)
Total Protein: 6 g/dL — ABNORMAL LOW (ref 6.5–8.1)

## 2023-01-07 LAB — CREATININE, SERUM
Creatinine, Ser: 1.05 mg/dL (ref 0.61–1.24)
GFR, Estimated: >60 mL/min (ref >60)

## 2023-01-07 LAB — ETHANOL: Alcohol, Ethyl (B): <10 mg/dL (ref <10)

## 2023-01-07 LAB — SAMPLE TO BLOOD BANK

## 2023-01-07 LAB — I-STAT CG4 LACTIC ACID, ED: Lactic Acid, Venous: 2.1 mmol/L (ref 0.5–1.9)

## 2023-01-07 MED ORDER — OXYCODONE HCL 5 MG PO TABS: 5.0000 mg | ORAL_TABLET | ORAL | Status: DC | PRN

## 2023-01-07 MED ORDER — ONDANSETRON 4 MG PO TBDP: 4.0000 mg | ORAL_TABLET | Freq: Four times a day (QID) | ORAL | Status: DC | PRN

## 2023-01-07 MED ORDER — ENOXAPARIN SODIUM 30 MG/0.3ML IJ SOSY
30.0000 mg | PREFILLED_SYRINGE | Freq: Two times a day (BID) | INTRAMUSCULAR | Status: DC
  Administered 2023-01-09 – 2023-01-10 (×3): 30 mg via SUBCUTANEOUS
  Filled 2023-01-07 (×3): qty 0.3

## 2023-01-07 MED ORDER — METOPROLOL TARTRATE 5 MG/5ML IV SOLN: 5.0000 mg | Freq: Four times a day (QID) | INTRAVENOUS | Status: DC | PRN

## 2023-01-07 MED ORDER — METHOCARBAMOL 500 MG PO TABS
500.0000 mg | ORAL_TABLET | Freq: Three times a day (TID) | ORAL | Status: DC
  Administered 2023-01-07 – 2023-01-09 (×5): 500 mg via ORAL
  Filled 2023-01-07 (×5): qty 1

## 2023-01-07 MED ORDER — HYDROMORPHONE HCL 1 MG/ML IJ SOLN
1.0000 mg | INTRAMUSCULAR | Status: DC | PRN
  Administered 2023-01-07 – 2023-01-09 (×15): 1 mg via INTRAVENOUS
  Filled 2023-01-07 (×15): qty 1

## 2023-01-07 MED ORDER — ACETAMINOPHEN 500 MG PO TABS
1000.0000 mg | ORAL_TABLET | Freq: Four times a day (QID) | ORAL | Status: DC
  Administered 2023-01-07 – 2023-01-10 (×9): 1000 mg via ORAL
  Filled 2023-01-07 (×11): qty 2

## 2023-01-07 MED ORDER — DOCUSATE SODIUM 100 MG PO CAPS
100.0000 mg | ORAL_CAPSULE | Freq: Two times a day (BID) | ORAL | Status: DC
  Administered 2023-01-08: 100 mg via ORAL
  Filled 2023-01-07 (×6): qty 1

## 2023-01-07 MED ORDER — DIPHENHYDRAMINE HCL 25 MG PO CAPS
25.0000 mg | ORAL_CAPSULE | Freq: Four times a day (QID) | ORAL | Status: DC | PRN
  Administered 2023-01-07: 25 mg via ORAL
  Filled 2023-01-07: qty 1

## 2023-01-07 MED ORDER — GABAPENTIN 300 MG PO CAPS
300.0000 mg | ORAL_CAPSULE | Freq: Three times a day (TID) | ORAL | Status: DC
  Administered 2023-01-07 – 2023-01-10 (×8): 300 mg via ORAL
  Filled 2023-01-07 (×8): qty 1

## 2023-01-07 MED ORDER — IOHEXOL 350 MG/ML SOLN
100.0000 mL | Freq: Once | INTRAVENOUS | Status: AC | PRN
  Administered 2023-01-07: 100 mL via INTRAVENOUS

## 2023-01-07 MED ORDER — POLYETHYLENE GLYCOL 3350 17 G PO PACK: 17.0000 g | PACK | Freq: Every day | ORAL | Status: DC | PRN

## 2023-01-07 MED ORDER — METHOCARBAMOL 1000 MG/10ML IJ SOLN
500.0000 mg | Freq: Three times a day (TID) | INTRAVENOUS | Status: DC
  Filled 2023-01-07: qty 5

## 2023-01-07 MED ORDER — ONDANSETRON HCL 4 MG/2ML IJ SOLN: 4.0000 mg | Freq: Four times a day (QID) | INTRAMUSCULAR | Status: DC | PRN

## 2023-01-07 MED ORDER — HYDRALAZINE HCL 20 MG/ML IJ SOLN: 10.0000 mg | INTRAMUSCULAR | Status: DC | PRN

## 2023-01-07 MED ORDER — FENTANYL CITRATE PF 50 MCG/ML IJ SOSY
50.0000 ug | PREFILLED_SYRINGE | Freq: Once | INTRAMUSCULAR | Status: AC
  Administered 2023-01-07: 50 ug via INTRAVENOUS
  Filled 2023-01-07: qty 1
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
## 2023-01-07 NOTE — ED Notes (Signed)
Pt is asking for his wife I told him give [place] a min to get everything settle and we will bring her back here.
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
## 2023-01-07 NOTE — Progress Notes (Signed)
RT not needed at this time
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
## 2023-01-08 ENCOUNTER — Encounter (HOSPITAL_COMMUNITY): Payer: Self-pay

## 2023-01-08 ENCOUNTER — Observation Stay (HOSPITAL_COMMUNITY): Payer: BC Managed Care – PPO

## 2023-01-08 DIAGNOSIS — D72829 Elevated white blood cell count, unspecified: Secondary | ICD-10-CM | POA: Diagnosis present

## 2023-01-08 DIAGNOSIS — E872 Acidosis, unspecified: Secondary | ICD-10-CM | POA: Diagnosis present

## 2023-01-08 DIAGNOSIS — Z888 Allergy status to other drugs, medicaments and biological substances status: Secondary | ICD-10-CM | POA: Diagnosis not present

## 2023-01-08 DIAGNOSIS — Z79899 Other long term (current) drug therapy: Secondary | ICD-10-CM | POA: Diagnosis not present

## 2023-01-08 DIAGNOSIS — S31143A Puncture wound of abdominal wall with foreign body, right lower quadrant without penetration into peritoneal cavity, initial encounter: Secondary | ICD-10-CM | POA: Diagnosis present

## 2023-01-08 DIAGNOSIS — Z6829 Body mass index (BMI) 29.0-29.9, adult: Secondary | ICD-10-CM | POA: Diagnosis not present

## 2023-01-08 DIAGNOSIS — I10 Essential (primary) hypertension: Secondary | ICD-10-CM | POA: Diagnosis present

## 2023-01-08 DIAGNOSIS — E785 Hyperlipidemia, unspecified: Secondary | ICD-10-CM | POA: Diagnosis present

## 2023-01-08 DIAGNOSIS — W3400XA Accidental discharge from unspecified firearms or gun, initial encounter: Secondary | ICD-10-CM | POA: Diagnosis not present

## 2023-01-08 DIAGNOSIS — S7411XA Injury of femoral nerve at hip and thigh level, right leg, initial encounter: Secondary | ICD-10-CM | POA: Diagnosis present

## 2023-01-08 DIAGNOSIS — E669 Obesity, unspecified: Secondary | ICD-10-CM | POA: Diagnosis present

## 2023-01-08 LAB — BASIC METABOLIC PANEL
Anion gap: 9 (ref 5–15)
BUN: 10 mg/dL (ref 6–20)
CO2: 25 mmol/L (ref 22–32)
Calcium: 8.6 mg/dL — ABNORMAL LOW (ref 8.9–10.3)
Chloride: 102 mmol/L (ref 98–111)
Creatinine, Ser: 1.1 mg/dL (ref 0.61–1.24)
GFR, Estimated: >60 mL/min (ref >60)
Glucose, Bld: 116 mg/dL — ABNORMAL HIGH (ref 70–99)
Potassium: 4.4 mmol/L (ref 3.5–5.1)
Sodium: 136 mmol/L (ref 135–145)

## 2023-01-08 LAB — CBC
HCT: 42.6 % (ref 39.0–52.0)
Hemoglobin: 14 g/dL (ref 13.0–17.0)
MCH: 30.2 pg (ref 26.0–34.0)
MCHC: 32.9 g/dL (ref 30.0–36.0)
MCV: 91.8 fL (ref 80.0–100.0)
Platelets: 321 10*3/uL (ref 150–400)
RBC: 4.64 MIL/uL (ref 4.22–5.81)
RDW: 14.2 % (ref 11.5–15.5)
WBC: 10 10*3/uL (ref 4.0–10.5)
nRBC: 0 % (ref 0.0–0.2)

## 2023-01-08 LAB — HIV ANTIBODY (ROUTINE TESTING W REFLEX): HIV Screen 4th Generation wRfx: NONREACTIVE

## 2023-01-08 MED ORDER — BACITRACIN ZINC 500 UNIT/GM EX OINT
TOPICAL_OINTMENT | Freq: Every day | CUTANEOUS | Status: DC
  Administered 2023-01-08: 1 via TOPICAL
  Administered 2023-01-09: 31.5 via TOPICAL
  Filled 2023-01-08: qty 28.35

## 2023-01-08 NOTE — Plan of Care (Signed)
  Problem: Education: Goal: Knowledge of General Education information will improve Description: Including pain rating scale, medication(s)/side effects and non-pharmacologic comfort measures Outcome: Progressing   Problem: Activity: Goal: Risk for activity intolerance will decrease Outcome: Progressing   Problem: Nutrition: Goal: Adequate nutrition will be maintained Outcome: Progressing   Problem: Coping: Goal: Level of anxiety will decrease Outcome: Progressing   Problem: Pain Managment: Goal: General experience of comfort will improve Outcome: Progressing   Problem: Safety: Goal: Ability to remain free from injury will improve Outcome: Progressing   Problem: Skin Integrity: Goal: Risk for impaired skin integrity will decrease Outcome: Not Progressing

## 2023-01-08 NOTE — Progress Notes (Signed)
Patient went to stand up earlier and noticed increased bleeding to wound dressing site on right hip. Dressing reinforced. No drainage seen at this time. MD notified with no new orders thus far.

## 2023-01-08 NOTE — Progress Notes (Signed)
PT Cancellation Note  Patient Details Name: Victor Ali MRN: [id number] DOB: 06-26-1970   Cancelled Treatment:    Reason Eval/Treat Not Completed: Medical issues which prohibited therapy  Discussed pt status with RN, who reports very recent incr bleeding when getting up; Will hold PT eval for now and plan to return tomorrow;   Van Clines, PT  Acute Rehabilitation Services Office [phone] Secure Chat welcomed  Victor Ali 01/08/2023, 3:08 PM
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
## 2023-01-08 NOTE — Progress Notes (Signed)
VASCULAR LAB    Duplex of right groin has been performed.  See CV proc for preliminary results.   Gerilyn Stargell, RVT 01/08/2023, 1:08 PM

## 2023-01-08 NOTE — Plan of Care (Signed)
Vascular surgery update:  Duplex negative for pseudoaneurysm. Ok for DC from vascular perspective  Will arrange for outpatient follow up.  Daria Pastures MD [facility] Phone Number: [phone] 01/08/2023 2:19 PM
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
## 2023-01-09 MED ORDER — METHOCARBAMOL 500 MG PO TABS
500.0000 mg | ORAL_TABLET | Freq: Four times a day (QID) | ORAL | Status: DC
  Administered 2023-01-09: 500 mg via ORAL
  Filled 2023-01-09: qty 1

## 2023-01-09 MED ORDER — METHOCARBAMOL 1000 MG/10ML IJ SOLN
500.0000 mg | Freq: Four times a day (QID) | INTRAVENOUS | Status: DC
  Filled 2023-01-09: qty 5

## 2023-01-09 MED ORDER — KETOROLAC TROMETHAMINE 15 MG/ML IJ SOLN
15.0000 mg | Freq: Four times a day (QID) | INTRAMUSCULAR | Status: DC
  Administered 2023-01-09 – 2023-01-10 (×4): 15 mg via INTRAVENOUS
  Filled 2023-01-09 (×4): qty 1

## 2023-01-09 MED ORDER — OXYCODONE HCL 5 MG PO TABS
5.0000 mg | ORAL_TABLET | ORAL | Status: DC | PRN
  Administered 2023-01-09: 10 mg via ORAL
  Administered 2023-01-09 (×2): 5 mg via ORAL
  Administered 2023-01-10: 10 mg via ORAL
  Filled 2023-01-09 (×2): qty 2
  Filled 2023-01-09: qty 1
  Filled 2023-01-09: qty 2

## 2023-01-09 MED ORDER — HYDROMORPHONE HCL 1 MG/ML IJ SOLN
1.0000 mg | INTRAMUSCULAR | Status: DC | PRN
  Administered 2023-01-09: 1 mg via INTRAVENOUS
  Filled 2023-01-09: qty 1

## 2023-01-09 MED ORDER — METHOCARBAMOL 750 MG PO TABS
750.0000 mg | ORAL_TABLET | Freq: Four times a day (QID) | ORAL | Status: DC
  Administered 2023-01-09 – 2023-01-10 (×4): 750 mg via ORAL
  Filled 2023-01-09 (×4): qty 1

## 2023-01-09 NOTE — TOC CAGE-AID Note (Signed)
Transition of Care [facility]) - CAGE-AID Screening   Patient Details  Name: Victor Ali MRN: [id number] Date of Birth: [birth year]  Transition of Care [facility]) CM/SW Contact:    Katha Hamming, RN Phone Number: 01/09/2023, 9:40 AM   Clinical Narrative: Reports alcohol use about every three days, reports no withdrawal s/s. No resources indicated.   CAGE-AID Screening:    Have You Ever Felt You Ought to Cut Down on Your Drinking or Drug Use?: No Have People Annoyed You By Critizing Your Drinking Or Drug Use?: No Have You Felt Bad Or Guilty About Your Drinking Or Drug Use?: No Have You Ever Had a Drink or Used Drugs First Thing In The Morning to Steady Your Nerves or to Get Rid of a Hangover?: No CAGE-AID Score: 0  Substance Abuse Education Offered: No
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
## 2023-01-09 NOTE — Plan of Care (Signed)
Problem: Education: Goal: Knowledge of General Education information will improve Description: Including pain rating scale, medication(s)/side effects and non-pharmacologic comfort measures Outcome: Progressing   Problem: Health Behavior/Discharge Planning: Goal: Ability to manage health-related needs will improve Outcome: Progressing   Problem: Clinical Measurements: Goal: Ability to maintain clinical measurements within normal limits will improve Outcome: Progressing Goal: Will remain free from infection Outcome: Progressing   Problem: Activity: Goal: Risk for activity intolerance will decrease Outcome: Progressing   Problem: Pain Managment: Goal: General experience of comfort will improve Outcome: Progressing   Problem: Safety: Goal: Ability to remain free from injury will improve Outcome: Progressing   Problem: Skin Integrity: Goal: Risk for impaired skin integrity will decrease Outcome: Progressing

## 2023-01-10 MED ORDER — OXYCODONE HCL 5 MG PO TABS: 5.0000 mg | ORAL_TABLET | Freq: Four times a day (QID) | ORAL | 0 refills | Status: DC | PRN

## 2023-01-10 MED ORDER — KETOROLAC TROMETHAMINE 15 MG/ML IJ SOLN: 30.0000 mg | Freq: Four times a day (QID) | INTRAMUSCULAR | Status: DC

## 2023-01-10 MED ORDER — POLYETHYLENE GLYCOL 3350 17 G PO PACK: 17.0000 g | PACK | Freq: Every day | ORAL | Status: DC | PRN

## 2023-01-10 MED ORDER — KETOROLAC TROMETHAMINE 15 MG/ML IJ SOLN
30.0000 mg | Freq: Four times a day (QID) | INTRAMUSCULAR | Status: DC
  Administered 2023-01-10: 30 mg via INTRAVENOUS
  Filled 2023-01-10: qty 2

## 2023-01-10 MED ORDER — IBUPROFEN 200 MG PO TABS: 600.0000 mg | ORAL_TABLET | Freq: Four times a day (QID) | ORAL | Status: AC | PRN

## 2023-01-10 MED ORDER — METHOCARBAMOL 500 MG PO TABS: 750.0000 mg | ORAL_TABLET | Freq: Four times a day (QID) | ORAL | 0 refills | Status: DC | PRN

## 2023-01-10 MED ORDER — GABAPENTIN 300 MG PO CAPS: 300.0000 mg | ORAL_CAPSULE | Freq: Three times a day (TID) | ORAL | 0 refills | Status: DC

## 2023-01-10 MED ORDER — ACETAMINOPHEN 500 MG PO TABS: 1000.0000 mg | ORAL_TABLET | Freq: Four times a day (QID) | ORAL | Status: AC | PRN
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
## 2023-01-10 NOTE — Plan of Care (Signed)
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
## 2023-02-03 ENCOUNTER — Other Ambulatory Visit: Payer: Self-pay

## 2023-02-03 ENCOUNTER — Encounter: Payer: BC Managed Care – PPO | Admitting: Vascular Surgery

## 2023-02-03 ENCOUNTER — Encounter (HOSPITAL_COMMUNITY): Payer: BC Managed Care – PPO

## 2023-02-03 DIAGNOSIS — W3400XA Accidental discharge from unspecified firearms or gun, initial encounter: Secondary | ICD-10-CM
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
## 2023-02-17 ENCOUNTER — Ambulatory Visit (HOSPITAL_COMMUNITY)
Admission: RE | Admit: 2023-02-17 | Discharge: 2023-02-17 | Disposition: A | Discharge status: Home / Self Care | Payer: BC Managed Care – PPO | Source: Ambulatory Visit | Attending: Vascular Surgery | Admitting: Vascular Surgery

## 2023-02-17 ENCOUNTER — Encounter: Payer: Self-pay | Admitting: Vascular Surgery

## 2023-02-17 ENCOUNTER — Ambulatory Visit (INDEPENDENT_AMBULATORY_CARE_PROVIDER_SITE_OTHER): Payer: BC Managed Care – PPO | Admitting: Vascular Surgery

## 2023-02-17 VITALS — BP 139/94 | HR 84 | Temp 98.3°F | Resp 20 | Ht 72.0 in | Wt 223.0 lb

## 2023-02-17 DIAGNOSIS — W3400XA Accidental discharge from unspecified firearms or gun, initial encounter: Secondary | ICD-10-CM | POA: Diagnosis not present

## 2023-02-17 DIAGNOSIS — I724 Aneurysm of artery of lower extremity: Secondary | ICD-10-CM | POA: Insufficient documentation

## 2023-02-17 DIAGNOSIS — S71131D Puncture wound without foreign body, right thigh, subsequent encounter: Secondary | ICD-10-CM | POA: Diagnosis not present

## 2023-02-21 ENCOUNTER — Encounter: Payer: Self-pay | Admitting: Neurology
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
## 2023-03-01 ENCOUNTER — Encounter: Payer: Self-pay | Admitting: Neurology

## 2023-03-01 ENCOUNTER — Ambulatory Visit: Payer: BC Managed Care – PPO | Admitting: Neurology

## 2023-03-01 VITALS — BP 145/90 | HR 69 | Ht 72.0 in | Wt 227.0 lb

## 2023-03-01 DIAGNOSIS — R29898 Other symptoms and signs involving the musculoskeletal system: Secondary | ICD-10-CM

## 2023-03-01 DIAGNOSIS — W3400XA Accidental discharge from unspecified firearms or gun, initial encounter: Secondary | ICD-10-CM | POA: Diagnosis not present

## 2023-03-01 DIAGNOSIS — G5721 Lesion of femoral nerve, right lower limb: Secondary | ICD-10-CM | POA: Diagnosis not present

## 2023-03-01 DIAGNOSIS — R200 Anesthesia of skin: Secondary | ICD-10-CM

## 2023-03-01 DIAGNOSIS — R202 Paresthesia of skin: Secondary | ICD-10-CM
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
## 2023-03-06 ENCOUNTER — Ambulatory Visit: Payer: BC Managed Care – PPO | Admitting: Neurology

## 2023-03-06 ENCOUNTER — Telehealth: Payer: Self-pay | Admitting: Neurology

## 2023-03-06 DIAGNOSIS — R29898 Other symptoms and signs involving the musculoskeletal system: Secondary | ICD-10-CM

## 2023-03-06 DIAGNOSIS — R202 Paresthesia of skin: Secondary | ICD-10-CM

## 2023-03-06 DIAGNOSIS — G5721 Lesion of femoral nerve, right lower limb: Secondary | ICD-10-CM

## 2023-03-06 DIAGNOSIS — R200 Anesthesia of skin: Secondary | ICD-10-CM | POA: Diagnosis not present

## 2023-03-06 NOTE — Procedures (Signed)
[facility]  [address] [place], [address]  [place], [place] [address] Tel: [phone] Fax: [phone] Test Date:  03/06/2023  Patient: Victor Ali DOB: 03/31/1970 Physician: Jacquelyne Balint, MD  Sex: Male Height: 6\' 0"  Ref Phys: Jacquelyne Balint, MD  ID#: [id number]   Technician:    History: This is a 52 year old male with right leg weakness.  NCV & EMG Findings: Extensive electrodiagnostic evaluation of the right lower limb with additional nerve conduction studies of the left lower limb shows: Right saphenous sensory response is absent. Left saphenous, right sural, and right superficial peroneal/fibular sensory responses are within normal limits. Right femoral (rectus femoris) motor response is absent. Left femoral (rectus femoris), right peroneal/fibular (EDB), and tibial (AH) motor responses are within normal limits. Right H reflex latency is within normal limits. Chronic motor axon loss changes WITH accompanying active denervation changes are seen in the right vastus lateralis. Chronic motor axon loss changes WITHOUT definitive accompanying active denervation changes are seen in the right rectus femoris.  Impression: This is an abnormal study. The findings are most consistent with the following: Evidence of a right femoral mononeuropathy, proximal to the takeoff to the rectus femoris and vastus lateralis muscles but distal to takeoff for iliacus muscle. No electrodiagnostic evidence of a right lumbosacral (L2-S1) motor radiculopathy.  No electrodiagnostic evidence of a large fiber sensorimotor neuropathy.    ___________________________ Jacquelyne Balint, MD    Nerve Conduction Studies Motor Nerve Results    Latency Amplitude F-Lat Segment Distance CV Comment  Site (ms) Norm (mV) Norm (ms)  (cm) (m/s) Norm   Left Femoral (RF) Motor  Ing lig 2.7 - 7.2 -        Right Femoral (RF) Motor  Ing lig *NR - *NR -        Right Fibular (EDB) Motor  Ankle 3.9  < 6.0 7.4  > 2.5         Bel fib head 11.0 - 6.7 -  Bel fib head-Ankle 34 48  > 40   Pop fossa 13.1 - 6.4 -  Pop fossa-Bel fib head 9 43 -   Right Tibial (AH) Motor  Ankle 3.4  < 6.0 12.5  > 4.0        Knee 12.3 - 11.1 -  Knee-Ankle 43 48  > 40    Sensory Sites    Neg Peak Lat Amplitude (O-P) Segment Distance Velocity Comment  Site (ms) Norm (V) Norm  (cm) (ms)   Left Saphenous Sensory  14 cm-Ant med mall 3.8  < 4.4 6  > 4 14 cm-Ant med mall 14    Right Saphenous Sensory  14 cm-Ant med mall *NR  < 4.4 *NR  > 4 14 cm-Ant med mall 14    Right Superficial Fibular Sensory  14 cm-Ankle 3.0  < 4.6 7  > 4 14 cm-Ankle 14    Right Sural Sensory  Calf-Lat mall 3.8  < 4.6 15  > 4 Calf-Lat mall 14     H-Reflex Results    M-Lat H Lat H Neg Amp H-M Lat  Site (ms) (ms) Norm (mV) (ms)  Right Tibial H-Reflex  Pop fossa 6.5 34.5  < 35.0 3.6 28.0   Electromyography   Side Muscle Ins.Act Fibs Fasc Recrt Amp Dur Poly Activation Comment  Right Tib ant Nml Nml Nml Nml Nml Nml Nml Nml N/A  Right Gastroc MH Nml Nml Nml Nml Nml Nml Nml Nml N/A  Right Rectus fem 1+ Nml  Nml *2- *1+ *1+ *1+ *1- N/A  Right Vastus lat Nml *1+ Nml *2- *1+ *1+ *1+ *1- N/A  Right Iliacus Nml Nml Nml Nml Nml Nml Nml Nml N/A      Waveforms:  Motor           Sensory           H-Reflex

## 2023-03-06 NOTE — Telephone Encounter (Signed)
Discussed the results of patient's EMG after the procedure today. 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 Patient is to be starting PT soon. I will reach out to [facility] to see if there is any indication for surgical intervention if he does not continue to improve.  All questions were answered.  Jacquelyne Balint, MD [facility]

## 2023-03-07 ENCOUNTER — Telehealth: Payer: Self-pay | Admitting: Neurology

## 2023-03-07 ENCOUNTER — Other Ambulatory Visit: Payer: Self-pay

## 2023-03-07 DIAGNOSIS — G5721 Lesion of femoral nerve, right lower limb: Secondary | ICD-10-CM
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
## 2023-03-13 ENCOUNTER — Ambulatory Visit: Payer: BC Managed Care – PPO | Admitting: Neurosurgery

## 2023-03-13 VITALS — BP 136/86 | Ht 72.0 in | Wt 227.0 lb

## 2023-03-13 DIAGNOSIS — W3400XA Accidental discharge from unspecified firearms or gun, initial encounter: Secondary | ICD-10-CM | POA: Diagnosis not present

## 2023-03-13 DIAGNOSIS — G5721 Lesion of femoral nerve, right lower limb: Secondary | ICD-10-CM | POA: Diagnosis not present

## 2023-03-14 DIAGNOSIS — G5721 Lesion of femoral nerve, right lower limb: Secondary | ICD-10-CM | POA: Insufficient documentation

## 2023-04-19 ENCOUNTER — Ambulatory Visit: Payer: BC Managed Care – PPO | Admitting: Neurology
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
## 2023-08-30 ENCOUNTER — Encounter: Payer: Self-pay | Admitting: Neurology

## 2023-08-30 ENCOUNTER — Ambulatory Visit: Payer: BC Managed Care – PPO | Admitting: Neurology

## 2023-08-30 VITALS — BP 121/79 | HR 80 | Ht 72.0 in | Wt 223.0 lb

## 2023-08-30 DIAGNOSIS — G5721 Lesion of femoral nerve, right lower limb: Secondary | ICD-10-CM

## 2023-08-30 DIAGNOSIS — R202 Paresthesia of skin: Secondary | ICD-10-CM

## 2023-08-30 DIAGNOSIS — W3400XA Accidental discharge from unspecified firearms or gun, initial encounter: Secondary | ICD-10-CM

## 2023-08-30 DIAGNOSIS — R200 Anesthesia of skin: Secondary | ICD-10-CM | POA: Diagnosis not present

## 2023-08-30 NOTE — Patient Instructions (Signed)
 Continue PT as long as needed  You can start cutting back gabapentin  if you would like. I would recommend you cut out one of the doses, taking it twice daily at first. If that goes well and you want to cut back further, you could cut another dose after a couple of weeks. You can go back to three times daily if the pain in the left continues.  I will see you back in clinic in about 6 months. Please let me know if you have any questions or concerns in the meantime.   The physicians and staff at [facility] are committed to providing excellent care. You may receive a survey requesting feedback about your experience at our office. We strive to receive "very good" responses to the survey questions. If you feel that your experience would prevent you from giving the office a "very good " response, please contact our office to try to remedy the situation. We may be reached at [phone]. Thank you for taking the time out of your busy day to complete the survey.  Rommie Coats, MD [facility]
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
## 2024-03-07 ENCOUNTER — Ambulatory Visit: Admitting: Neurology

## 2024-03-07 ENCOUNTER — Encounter: Payer: Self-pay | Admitting: Neurology

## 2024-03-07 VITALS — BP 119/77 | HR 76 | Ht 72.0 in | Wt 222.0 lb

## 2024-03-07 DIAGNOSIS — G5721 Lesion of femoral nerve, right lower limb: Secondary | ICD-10-CM | POA: Diagnosis not present

## 2024-03-07 MED ORDER — GABAPENTIN 400 MG PO CAPS: 400.0000 mg | ORAL_CAPSULE | Freq: Three times a day (TID) | ORAL | Status: AC

## 2024-03-07 NOTE — Patient Instructions (Signed)
-  Continue gabapentin  400 mg three times daily -Continue PT  Return to clinic as needed  The physicians and staff at [facility] are committed to providing excellent care. You may receive a survey requesting feedback about your experience at our office. We strive to receive very good responses to the survey questions. If you feel that your experience would prevent you from giving the office a very good  response, please contact our office to try to remedy the situation. We may be reached at [phone]. Thank you for taking the time out of your busy day to complete the survey.  Venetia Potters, MD [facility]
